# Patient Record
Sex: Female | Born: 1990 | Race: Black or African American | Hispanic: No | Marital: Single | Smoking: Current some day smoker
Health system: Southern US, Community
[De-identification: ages and names within clinical notes are randomized; demographics above are authoritative.]

## PROBLEM LIST (undated history)

## (undated) ENCOUNTER — Inpatient Hospital Stay (HOSPITAL_COMMUNITY): Payer: Self-pay

## (undated) DIAGNOSIS — Z789 Other specified health status: Secondary | ICD-10-CM

## (undated) HISTORY — PX: CHOLECYSTECTOMY: SHX55

## (undated) HISTORY — PX: TONSILLECTOMY: SUR1361

## (undated) HISTORY — PX: HERNIA REPAIR: SHX51

---

## 2016-11-26 ENCOUNTER — Encounter (HOSPITAL_BASED_OUTPATIENT_CLINIC_OR_DEPARTMENT_OTHER): Payer: Self-pay | Admitting: Emergency Medicine

## 2016-11-26 ENCOUNTER — Emergency Department (HOSPITAL_BASED_OUTPATIENT_CLINIC_OR_DEPARTMENT_OTHER): Payer: Medicaid Other

## 2016-11-26 ENCOUNTER — Emergency Department (HOSPITAL_BASED_OUTPATIENT_CLINIC_OR_DEPARTMENT_OTHER)
Admission: EM | Admit: 2016-11-26 | Discharge: 2016-11-26 | Disposition: A | Discharge status: Home / Self Care | Payer: Medicaid Other | Attending: Emergency Medicine | Admitting: Emergency Medicine

## 2016-11-26 DIAGNOSIS — R102 Pelvic and perineal pain: Secondary | ICD-10-CM | POA: Diagnosis not present

## 2016-11-26 DIAGNOSIS — M549 Dorsalgia, unspecified: Secondary | ICD-10-CM | POA: Insufficient documentation

## 2016-11-26 DIAGNOSIS — R1084 Generalized abdominal pain: Secondary | ICD-10-CM | POA: Diagnosis not present

## 2016-11-26 DIAGNOSIS — O99331 Smoking (tobacco) complicating pregnancy, first trimester: Secondary | ICD-10-CM | POA: Diagnosis not present

## 2016-11-26 DIAGNOSIS — F17200 Nicotine dependence, unspecified, uncomplicated: Secondary | ICD-10-CM | POA: Insufficient documentation

## 2016-11-26 DIAGNOSIS — O26891 Other specified pregnancy related conditions, first trimester: Secondary | ICD-10-CM | POA: Insufficient documentation

## 2016-11-26 DIAGNOSIS — Z3A01 Less than 8 weeks gestation of pregnancy: Secondary | ICD-10-CM | POA: Diagnosis not present

## 2016-11-26 LAB — COMPREHENSIVE METABOLIC PANEL
ALT: 9 U/L — ABNORMAL LOW (ref 14–54)
AST: 12 U/L — ABNORMAL LOW (ref 15–41)
Albumin: 3.8 g/dL (ref 3.5–5.0)
Alkaline Phosphatase: 45 U/L (ref 38–126)
Anion gap: 6 (ref 5–15)
BUN: 8 mg/dL (ref 6–20)
CO2: 21 mmol/L — ABNORMAL LOW (ref 22–32)
Calcium: 8.8 mg/dL — ABNORMAL LOW (ref 8.9–10.3)
Chloride: 109 mmol/L (ref 101–111)
Creatinine, Ser: 0.62 mg/dL (ref 0.44–1.00)
GFR calc Af Amer: >60 mL/min (ref >60)
GFR calc non Af Amer: >60 mL/min (ref >60)
Glucose, Bld: 96 mg/dL (ref 65–99)
Potassium: 3.9 mmol/L (ref 3.5–5.1)
Sodium: 136 mmol/L (ref 135–145)
Total Bilirubin: 0.9 mg/dL (ref 0.3–1.2)
Total Protein: 6.1 g/dL — ABNORMAL LOW (ref 6.5–8.1)

## 2016-11-26 LAB — URINALYSIS, MICROSCOPIC (REFLEX)

## 2016-11-26 LAB — CBC WITH DIFFERENTIAL/PLATELET
Basophils Absolute: 0 10*3/uL (ref 0.0–0.1)
Basophils Relative: 0 %
Eosinophils Absolute: 0.1 10*3/uL (ref 0.0–0.7)
Eosinophils Relative: 2 %
HCT: 42.2 % (ref 36.0–46.0)
Hemoglobin: 14.1 g/dL (ref 12.0–15.0)
Lymphocytes Relative: 19 %
Lymphs Abs: 1.4 10*3/uL (ref 0.7–4.0)
MCH: 28.8 pg (ref 26.0–34.0)
MCHC: 33.4 g/dL (ref 30.0–36.0)
MCV: 86.1 fL (ref 78.0–100.0)
Monocytes Absolute: 0.6 10*3/uL (ref 0.1–1.0)
Monocytes Relative: 9 %
Neutro Abs: 5.3 10*3/uL (ref 1.7–7.7)
Neutrophils Relative %: 70 %
Platelets: 165 10*3/uL (ref 150–400)
RBC: 4.9 MIL/uL (ref 3.87–5.11)
RDW: 13 % (ref 11.5–15.5)
WBC: 7.5 10*3/uL (ref 4.0–10.5)

## 2016-11-26 LAB — HCG, QUANTITATIVE, PREGNANCY: hCG, Beta Chain, Quant, S: 569 m[IU]/mL — ABNORMAL HIGH (ref <5)

## 2016-11-26 LAB — URINALYSIS, ROUTINE W REFLEX MICROSCOPIC
Bilirubin Urine: NEGATIVE
GLUCOSE, UA: NEGATIVE mg/dL
HGB URINE DIPSTICK: NEGATIVE
Ketones, ur: 15 mg/dL — AB
Nitrite: NEGATIVE
PH: 6 (ref 5.0–8.0)
Protein, ur: NEGATIVE mg/dL
SPECIFIC GRAVITY, URINE: 1.017 (ref 1.005–1.030)

## 2016-11-26 LAB — LIPASE, BLOOD: Lipase: 20 U/L (ref 11–51)

## 2016-11-26 LAB — ABO/RH: ABO/RH(D): B POS

## 2016-11-26 LAB — PREGNANCY, URINE: Preg Test, Ur: POSITIVE — AB

## 2016-11-26 MED ORDER — CALCIUM CARBONATE ANTACID 500 MG PO CHEW
1.0000 | CHEWABLE_TABLET | Freq: Once | ORAL | Status: AC
  Administered 2016-11-26: 200 mg via ORAL
  Filled 2016-11-26: qty 1

## 2016-11-26 NOTE — Discharge Instructions (Signed)
Try Pepcid over the counter for the next few days for your abdominal symptoms. You may take tylenol for your pain but do not take ibuprofen.

## 2016-11-26 NOTE — ED Triage Notes (Addendum)
Generalized abdominal pain for two weeks.  Pt states she feels like something is moving in her abdomen.  Pt recently stopped breast feeding.  Some diarrhea today.  Poor appetite.  No vaginal discharge or odor.  No N/V

## 2016-11-26 NOTE — ED Provider Notes (Signed)
[facility]   CSN: [id number] Arrival date & time: 11/26/16  0759    History   Chief Complaint Chief Complaint  Patient presents with  . Abdominal Pain    HPI Sonia Mclaughlin is a 26 y.o. female who presents to the ED with 2 weeks of progressively worsening abdominal pain. She states noticing intermittent pain which she describes as "like butterflies in your stomach but painful" 2 weeks ago, which has since become constant. Pain is 5/10 and migrates "all over my belly and back", worse with movement and changing positions. Associated with low back pain. She has taken ibuprofen this week for the pain which is minimally helpful. Associated with one episode of diarrhea this morning but otherwise reports bowel movements have been normal. Denies fever/chills, CP/SOB, HA, n/v, dysuria, hematuria, melena, malodorous urine or vaginal discharge, and vaginal itching. She is unsure if she is pregnant and states periods have been irregular as she has been breastfeeding, which she recently stopped. LMP last month for 5 days. Of note, she states she was pregnant in November but miscarried.   The history is provided by the patient.    No past medical history on file.  There are no active problems to display for this patient.   Past Surgical History:  Procedure Laterality Date  . CHOLECYSTECTOMY    . HERNIA REPAIR    . TONSILLECTOMY      OB History    Gravida Para Term Preterm AB Living   1             SAB TAB Ectopic Multiple Live Births                   Home Medications    Prior to Admission medications   Medication Sig Start Date End Date Taking? Authorizing Provider  ibuprofen (ADVIL,MOTRIN) 100 MG/5ML suspension Take 200 mg by mouth every 4 (four) hours as needed.   Yes Historical Provider, MD    Family History No family history on file.  Social History Social History  Substance Use Topics  . Smoking status: Current Every Day Smoker  .  Smokeless tobacco: Never Used  . Alcohol use Not on file     Allergies   Sulfa antibiotics   Review of Systems Review of Systems  Constitutional: Negative for chills and fever.  HENT: Negative for congestion and sore throat.   Eyes: Negative for discharge.  Respiratory: Negative for cough and shortness of breath.   Cardiovascular: Negative for chest pain and leg swelling.  Gastrointestinal: Positive for abdominal pain. Negative for abdominal distention, blood in stool, constipation, diarrhea, nausea and vomiting.  Genitourinary: Negative for dysuria, hematuria, vaginal bleeding, vaginal discharge and vaginal pain.  Musculoskeletal: Positive for back pain. Negative for arthralgias.  Skin: Negative for rash and wound.  Neurological: Negative for dizziness, syncope and headaches.     Physical Exam Updated Vital Signs BP 134/81 (BP Location: Right Arm)   Pulse 62   Temp 98.7 F (37.1 C) (Oral)   Resp 18   Ht 5\' 8"  (1.727 m)   Wt 99.8 kg   LMP 10/24/2016   SpO2 100%   BMI 33.45 kg/m   Physical Exam  Constitutional: She is oriented to person, place, and time. She appears well-developed and well-nourished. No distress.  HENT:  Head: Normocephalic and atraumatic.  Eyes: Conjunctivae are normal. Right eye exhibits no discharge. Left eye exhibits no discharge. No scleral icterus.  Neck: Neck supple. No JVD present.  No tracheal deviation present. No thyromegaly present.  Cardiovascular: Normal rate, regular rhythm, normal heart sounds and intact distal pulses.   Pulmonary/Chest: Effort normal and breath sounds normal.  Abdominal: Soft. Bowel sounds are normal. She exhibits no distension and no mass. There is tenderness. There is no rebound.  ttp of epigastrium, negative Murphy's, negative psoas sign, no ttp at McBurney's point.   Genitourinary:  Genitourinary Comments: No CVA tenderness  Musculoskeletal: Normal range of motion. She exhibits no edema or deformity.  Midline low  back ttp.   Neurological: She is alert and oriented to person, place, and time.  Skin: Skin is warm and dry. Capillary refill takes less than 2 seconds. She is not diaphoretic.  Psychiatric: She has a normal mood and affect. Her behavior is normal. Judgment and thought content normal.     ED Treatments / Results  Labs (all labs ordered are listed, but only abnormal results are displayed) Labs Reviewed  PREGNANCY, URINE - Abnormal; Notable for the following:       Result Value   Preg Test, Ur POSITIVE (*)    All other components within normal limits  URINALYSIS, ROUTINE W REFLEX MICROSCOPIC - Abnormal; Notable for the following:    APPearance CLOUDY (*)    Ketones, ur 15 (*)    Leukocytes, UA SMALL (*)    All other components within normal limits  COMPREHENSIVE METABOLIC PANEL - Abnormal; Notable for the following:    CO2 21 (*)    Calcium 8.8 (*)    Total Protein 6.1 (*)    AST 12 (*)    ALT 9 (*)    All other components within normal limits  URINALYSIS, MICROSCOPIC (REFLEX) - Abnormal; Notable for the following:    Bacteria, UA MANY (*)    Squamous Epithelial / LPF 0-5 (*)    All other components within normal limits  HCG, QUANTITATIVE, PREGNANCY - Abnormal; Notable for the following:    hCG, Beta Chain, Quant, S 569 (*)    All other components within normal limits  URINE CULTURE  CBC WITH DIFFERENTIAL/PLATELET  LIPASE, BLOOD  ABO/RH    EKG  EKG Interpretation None       Radiology US Ob Comp < 14 Wks  Result Date: 11/26/2016 CLINICAL DATA:  Pelvic pain for 2 weeks with positive pregnancy test EXAM: OBSTETRIC <14 WK [place] AND TRANSVAGINAL OB US TECHNIQUE: Both transabdominal and transvaginal ultrasound examinations were performed for complete evaluation of the gestation as well as the maternal uterus, adnexal regions, and pelvic cul-de-sac. Transvaginal technique was performed to assess early pregnancy. COMPARISON:  None. FINDINGS: Decidual reaction is noted within the  uterus. A tiny hypoechoic area is noted within the endometrium although no definitive gestational sac is seen. Given the beta HCG level it is likely too early for significant visualization. The right ovary is within normal limits. The left ovary demonstrates a 1.6 cm partially cystic lesion likely representing a complicated cyst. No significant free pelvic fluid is noted. IMPRESSION: Tiny hypoechoic area within the endometrium which may represent a very early gestational sac. Continued follow-up and monitoring of beta HCG levels is recommended. Partially cystic lesion in the left ovary. Electronically Signed   By: Alcide Clever M.D.   On: 11/26/2016 10:48   US Ob Transvaginal  Result Date: 11/26/2016 CLINICAL DATA:  Pelvic pain for 2 weeks with positive pregnancy test EXAM: OBSTETRIC <14 WK [place] AND TRANSVAGINAL OB US TECHNIQUE: Both transabdominal and transvaginal ultrasound examinations were performed for complete  evaluation of the gestation as well as the maternal uterus, adnexal regions, and pelvic cul-de-sac. Transvaginal technique was performed to assess early pregnancy. COMPARISON:  None. FINDINGS: Decidual reaction is noted within the uterus. A tiny hypoechoic area is noted within the endometrium although no definitive gestational sac is seen. Given the beta HCG level it is likely too early for significant visualization. The right ovary is within normal limits. The left ovary demonstrates a 1.6 cm partially cystic lesion likely representing a complicated cyst. No significant free pelvic fluid is noted. IMPRESSION: Tiny hypoechoic area within the endometrium which may represent a very early gestational sac. Continued follow-up and monitoring of beta HCG levels is recommended. Partially cystic lesion in the left ovary. Electronically Signed   By: Alcide Clever M.D.   On: 11/26/2016 10:48    Procedures Procedures (including critical care time)  Medications Ordered in ED Medications  calcium carbonate  (TUMS - dosed in mg elemental calcium) chewable tablet 200 mg of elemental calcium (200 mg of elemental calcium Oral Given 11/26/16 1125)     Initial Impression / Assessment and Plan / ED Course  I have reviewed the triage vital signs and the nursing notes.  Pertinent labs & imaging results that were available during my care of the patient were reviewed by me and considered in my medical decision making (see chart for details).     25yof presents to ED with chief complaint generalized abdominal pain and low back pain for 2 weeks. Pt afebrile VSS. CBC and CMP unremarkable. No obvious infection on UA, urine sample sent for culture. Urine positive for pregnancy, quantitative B-HCG 569 consistent with gest age 34 weeks. Low suspicion of ectopic, PID in absence of fever/chills and adnexal tenderness. Transvaginal US shows a hypoechoic area within the endometrium that may indicate early gestational sac and partially cystic lesion of left ovary. However, cannot rule out presence of ectopic pregnancy at this time with low beta HCG, and pt will require follow up with OB to track progress of pregnancy. Provided pt with information to establish care at local women's clinic as she is new to town, and discussed medications that are safe to take in pregnancy. Advised that she may continue to take tylenol for abd pain and recommended taking OTC Pepcid for possible GERD. Extensively discussed indications to return to ED.   Pt seen and evaluated by Dr. Clarene Duke.   Final Clinical Impressions(s) / ED Diagnoses   Final diagnoses:  Less than [redacted] weeks gestation of pregnancy  Generalized abdominal pain    New Prescriptions New Prescriptions   No medications on file     Jeanie Sewer, [place] 11/26/16 1600    Laurence Spates, MD 12/04/16 [phone]

## 2016-11-27 LAB — URINE CULTURE

## 2016-12-03 ENCOUNTER — Inpatient Hospital Stay (HOSPITAL_COMMUNITY)
Admission: AD | Admit: 2016-12-03 | Discharge: 2016-12-03 | Disposition: A | Discharge status: Home / Self Care | Payer: Medicaid Other | Source: Ambulatory Visit | Attending: Family Medicine | Admitting: Family Medicine

## 2016-12-03 ENCOUNTER — Inpatient Hospital Stay (HOSPITAL_COMMUNITY): Payer: Medicaid Other

## 2016-12-03 ENCOUNTER — Encounter (HOSPITAL_COMMUNITY): Payer: Self-pay | Admitting: *Deleted

## 2016-12-03 DIAGNOSIS — B9689 Other specified bacterial agents as the cause of diseases classified elsewhere: Secondary | ICD-10-CM | POA: Diagnosis not present

## 2016-12-03 DIAGNOSIS — O23591 Infection of other part of genital tract in pregnancy, first trimester: Secondary | ICD-10-CM | POA: Diagnosis not present

## 2016-12-03 DIAGNOSIS — O26899 Other specified pregnancy related conditions, unspecified trimester: Secondary | ICD-10-CM

## 2016-12-03 DIAGNOSIS — R109 Unspecified abdominal pain: Secondary | ICD-10-CM

## 2016-12-03 DIAGNOSIS — O26891 Other specified pregnancy related conditions, first trimester: Secondary | ICD-10-CM | POA: Diagnosis not present

## 2016-12-03 DIAGNOSIS — O99331 Smoking (tobacco) complicating pregnancy, first trimester: Secondary | ICD-10-CM | POA: Diagnosis not present

## 2016-12-03 DIAGNOSIS — F17200 Nicotine dependence, unspecified, uncomplicated: Secondary | ICD-10-CM | POA: Insufficient documentation

## 2016-12-03 DIAGNOSIS — Z3A01 Less than 8 weeks gestation of pregnancy: Secondary | ICD-10-CM | POA: Insufficient documentation

## 2016-12-03 DIAGNOSIS — N760 Acute vaginitis: Secondary | ICD-10-CM

## 2016-12-03 HISTORY — DX: Other specified health status: Z78.9

## 2016-12-03 LAB — URINALYSIS, ROUTINE W REFLEX MICROSCOPIC
Bilirubin Urine: NEGATIVE
Glucose, UA: NEGATIVE mg/dL
Hgb urine dipstick: NEGATIVE
KETONES UR: NEGATIVE mg/dL
LEUKOCYTES UA: NEGATIVE
NITRITE: NEGATIVE
PH: 7 (ref 5.0–8.0)
PROTEIN: NEGATIVE mg/dL
Specific Gravity, Urine: 1.018 (ref 1.005–1.030)

## 2016-12-03 LAB — WET PREP, GENITAL
Sperm: NONE SEEN
Trich, Wet Prep: NONE SEEN
YEAST WET PREP: NONE SEEN

## 2016-12-03 LAB — CBC
HCT: 42 % (ref 36.0–46.0)
HEMOGLOBIN: 13.7 g/dL (ref 12.0–15.0)
MCH: 28.6 pg (ref 26.0–34.0)
MCHC: 32.6 g/dL (ref 30.0–36.0)
MCV: 87.7 fL (ref 78.0–100.0)
PLATELETS: 215 10*3/uL (ref 150–400)
RBC: 4.79 MIL/uL (ref 3.87–5.11)
RDW: 13.2 % (ref 11.5–15.5)
WBC: 9 10*3/uL (ref 4.0–10.5)

## 2016-12-03 LAB — HCG, QUANTITATIVE, PREGNANCY: HCG, BETA CHAIN, QUANT, S: 10447 m[IU]/mL — AB (ref <5)

## 2016-12-03 MED ORDER — METRONIDAZOLE 500 MG PO TABS: 500.0000 mg | ORAL_TABLET | Freq: Two times a day (BID) | ORAL | 0 refills | Status: AC

## 2016-12-03 MED ORDER — METRONIDAZOLE 500 MG PO TABS: 500.0000 mg | ORAL_TABLET | Freq: Two times a day (BID) | ORAL | 0 refills | Status: DC

## 2016-12-03 NOTE — Discharge Instructions (Signed)
Bacterial Vaginosis °Bacterial vaginosis is an infection of the vagina. It happens when too many germs (bacteria) grow in the vagina. This infection puts you at risk for infections from sex (STIs). Treating this infection can lower your risk for some STIs. You should also treat this if you are pregnant. It can cause your baby to be born early. °Follow these instructions at home: °Medicines °· Take over-the-counter and prescription medicines only as told by your doctor. °· Take or use your antibiotic medicine as told by your doctor. Do not stop taking or using it even if you start to feel better. °General instructions °· If you your sexual partner is a woman, tell her that you have this infection. She needs to get treatment if she has symptoms. If you have a female partner, he does not need to be treated. °· During treatment: °? Avoid sex. °? Do not douche. °? Avoid alcohol as told. °? Avoid breastfeeding as told. °· Drink enough fluid to keep your pee (urine) clear or pale yellow. °· Keep your vagina and butt (rectum) clean. °? Wash the area with warm water every day. °? Wipe from front to back after you use the toilet. °· Keep all follow-up visits as told by your doctor. This is important. °Preventing this condition °· Do not douche. °· Use only warm water to wash around your vagina. °· Use protection when you have sex. This includes: °? Latex condoms. °? Dental dams. °· Limit how many people you have sex with. It is best to only have sex with the same person (be monogamous). °· Get tested for STIs. Have your partner get tested. °· Wear underwear that is cotton or lined with cotton. °· Avoid tight pants and pantyhose. This is most important in summer. °· Do not use any products that have nicotine or tobacco in them. These include cigarettes and e-cigarettes. If you need help quitting, ask your doctor. °· Do not use illegal drugs. °· Limit how much alcohol you drink. °Contact a doctor if: °· Your symptoms do not get  better, even after you are treated. °· You have more discharge or pain when you pee (urinate). °· You have a fever. °· You have pain in your belly (abdomen). °· You have pain with sex. °· Your bleed from your vagina between periods. °Summary °· This infection happens when too many germs (bacteria) grow in the vagina. °· Treating this condition can lower your risk for some infections from sex (STIs). °· You should also treat this if you are pregnant. It can cause early (premature) birth. °· Do not stop taking or using your antibiotic medicine even if you start to feel better. °This information is not intended to replace advice given to you by your health care provider. Make sure you discuss any questions you have with your health care provider. °Document Released: 06/04/2008 Document Revised: 05/11/2016 Document Reviewed: 05/11/2016 °Elsevier Interactive Patient Education © 2017 [employer] ° °First Trimester of Pregnancy °The first trimester of pregnancy is from week 1 until the end of week 13 (months 1 through 3). During this time, your baby will begin to develop inside you. At 6-8 weeks, the eyes and face are formed, and the heartbeat can be seen on ultrasound. At the end of 12 weeks, all the baby's organs are formed. Prenatal care is all the medical care you receive before the birth of your baby. Make sure you get good prenatal care and follow all of your doctor's instructions. °Follow these   instructions at home: °Medicines °· Take over-the-counter and prescription medicines only as told by your doctor. Some medicines are safe and some medicines are not safe during pregnancy. °· Take a prenatal vitamin that contains at least 600 micrograms (mcg) of folic acid. °· If you have trouble pooping (constipation), take medicine that will make your stool soft (stool softener) if your doctor approves. °Eating and drinking °· Eat regular, healthy meals. °· Your doctor will tell you the amount of weight gain that is right  for you. °· Avoid raw meat and uncooked cheese. °· If you feel sick to your stomach (nauseous) or throw up (vomit): °? Eat 4 or 5 small meals a day instead of 3 large meals. °? Try eating a few soda crackers. °? Drink liquids between meals instead of during meals. °· To prevent constipation: °? Eat foods that are high in fiber, like fresh fruits and vegetables, whole grains, and beans. °? Drink enough fluids to keep your pee (urine) clear or pale yellow. °Activity °· Exercise only as told by your doctor. Stop exercising if you have cramps or pain in your lower belly (abdomen) or low back. °· Do not exercise if it is too hot, too humid, or if you are in a place of great height (high altitude). °· Try to avoid standing for long periods of time. Move your legs often if you must stand in one place for a long time. °· Avoid heavy lifting. °· Wear low-heeled shoes. Sit and stand up straight. °· You can have sex unless your doctor tells you not to. °Relieving pain and discomfort °· Wear a good support bra if your breasts are sore. °· Take warm water baths (sitz baths) to soothe pain or discomfort caused by hemorrhoids. Use hemorrhoid cream if your doctor says it is okay. °· Rest with your legs raised if you have leg cramps or low back pain. °· If you have puffy, bulging veins (varicose veins) in your legs: °? Wear support hose or compression stockings as told by your doctor. °? Raise (elevate) your feet for 15 minutes, 3-4 times a day. °? Limit salt in your food. °Prenatal care °· Schedule your prenatal visits by the twelfth week of pregnancy. °· Write down your questions. Take them to your prenatal visits. °· Keep all your prenatal visits as told by your doctor. This is important. °Safety °· Wear your seat belt at all times when driving. °· Make a list of emergency phone numbers. The list should include numbers for family, friends, the hospital, and police and fire departments. °General instructions °· Ask your doctor for  a referral to a local prenatal class. Begin classes no later than at the start of month 6 of your pregnancy. °· Ask for help if you need counseling or if you need help with nutrition. Your doctor can give you advice or tell you where to go for help. °· Do not use hot tubs, steam rooms, or saunas. °· Do not douche or use tampons or scented sanitary pads. °· Do not cross your legs for long periods of time. °· Avoid all herbs and alcohol. Avoid drugs that are not approved by your doctor. °· Do not use any tobacco products, including cigarettes, chewing tobacco, and electronic cigarettes. If you need help quitting, ask your doctor. You may get counseling or other support to help you quit. °· Avoid cat litter boxes and soil used by cats. These carry germs that can cause birth defects in the baby and   can cause a loss of your baby (miscarriage) or stillbirth. °· Visit your dentist. At home, brush your teeth with a soft toothbrush. Be gentle when you floss. °Contact a doctor if: °· You are dizzy. °· You have mild cramps or pressure in your lower belly. °· You have a nagging pain in your belly area. °· You continue to feel sick to your stomach, you throw up, or you have watery poop (diarrhea). °· You have a bad smelling fluid coming from your vagina. °· You have pain when you pee (urinate). °· You have increased puffiness (swelling) in your face, hands, legs, or ankles. °Get help right away if: °· You have a fever. °· You are leaking fluid from your vagina. °· You have spotting or bleeding from your vagina. °· You have very bad belly cramping or pain. °· You gain or lose weight rapidly. °· You throw up blood. It may look like coffee grounds. °· You are around people who have German measles, fifth disease, or chickenpox. °· You have a very bad headache. °· You have shortness of breath. °· You have any kind of trauma, such as from a fall or a car accident. °Summary °· The first trimester of pregnancy is from week 1 until the  end of week 13 (months 1 through 3). °· To take care of yourself and your unborn baby, you will need to eat healthy meals, take medicines only if your doctor tells you to do so, and do activities that are safe for you and your baby. °· Keep all follow-up visits as told by your doctor. This is important as your doctor will have to ensure that your baby is healthy and growing well. °This information is not intended to replace advice given to you by your health care provider. Make sure you discuss any questions you have with your health care provider. °Document Released: 02/12/2008 Document Revised: 09/03/2016 Document Reviewed: 09/03/2016 °Elsevier Interactive Patient Education © 2017 [employer] ° °

## 2016-12-03 NOTE — MAU Provider Note (Signed)
History     CSN: [id number]  Arrival date and time: 12/03/16 1331   None    [id number] @[redacted]w[redacted]d  by sure LMP here with abdominal pain. She reports pain started about 1 week ago. Pain is at the umbilicus and radiates to lower abdomen. Rates pain 4/10. Has not taken anything. Standing for long periods of time worsens the pain. She denies urinary sx. Denies VB or vaginal discharge. No fevers. Eating and drinking well. She was seen in another ED 1 week ago for same sx, quant was 569 and no IUP was seen. She was instructed to f/u with an OB but is new to the area and wasn't sure how to get an appt.    Past Medical History:  Diagnosis Date  . Medical history non-contributory     Past Surgical History:  Procedure Laterality Date  . CHOLECYSTECTOMY    . HERNIA REPAIR    . TONSILLECTOMY      Family History  Problem Relation Age of Onset  . Diabetes Mother   . Hypertension Mother   . Diabetes Father   . Hypertension Father   . Seizures Sister   . Seizures Brother     Social History  Substance Use Topics  . Smoking status: Current Some Day Smoker  . Smokeless tobacco: Never Used  . Alcohol use No    Allergies:  Allergies  Allergen Reactions  . Sulfa Antibiotics Anaphylaxis and Hives    Prescriptions Prior to Admission  Medication Sig Dispense Refill Last Dose  . ibuprofen (ADVIL,MOTRIN) 200 MG tablet Take 200 mg by mouth every 6 (six) hours as needed for mild pain (For tooth pain.). Patient squeezes the liquid out of an Advil liquid gel onto tooth to relieve tooth pain.   Past Week at Unknown time    Review of Systems  Constitutional: Negative for fever.  Gastrointestinal: Positive for abdominal pain. Negative for constipation and diarrhea.  Genitourinary: Negative for dysuria, frequency, hematuria, urgency, vaginal bleeding and vaginal discharge.  Neurological: Positive for light-headedness (occasionally).   Physical Exam   Blood pressure 134/76, pulse 83, temperature 98.5  F (36.9 C), resp. rate 18, height 5\' 8"  (1.727 m), weight 100.2 kg (221 lb), last menstrual period 10/24/2016.  Physical Exam  Nursing note and vitals reviewed. Constitutional: She is oriented to person, place, and time. She appears well-developed and well-nourished. No distress (appears comfortable).  HENT:  Head: Normocephalic and atraumatic.  Neck: Normal range of motion.  Cardiovascular: Normal rate.   Respiratory: Effort normal.  GI: Soft. She exhibits no distension and no mass. There is no tenderness. There is no rebound and no guarding.  Genitourinary:  Genitourinary Comments: External: no lesions or erythema Vagina: rugated, parous, small thin white frothy discharge Uterus: non enlarged, anteverted, non tender, no CMT Adnexae: no masses, no tenderness left, + tenderness right   Musculoskeletal: Normal range of motion.  Neurological: She is alert and oriented to person, place, and time.  Skin: Skin is warm and dry.  Psychiatric: She has a normal mood and affect.   Results for orders placed or performed during the hospital encounter of 12/03/16 (from the past 24 hour(s))  Urinalysis, Routine w reflex microscopic     Status: None   Collection Time: 12/03/16  2:00 PM  Result Value Ref Range   Color, Urine YELLOW YELLOW   APPearance CLEAR CLEAR   Specific Gravity, Urine 1.018 1.005 - 1.030   pH 7.0 5.0 - 8.0   Glucose, UA NEGATIVE NEGATIVE mg/dL  Hgb urine dipstick NEGATIVE NEGATIVE   Bilirubin Urine NEGATIVE NEGATIVE   Ketones, ur NEGATIVE NEGATIVE mg/dL   Protein, ur NEGATIVE NEGATIVE mg/dL   Nitrite NEGATIVE NEGATIVE   Leukocytes, UA NEGATIVE NEGATIVE  CBC     Status: None   Collection Time: 12/03/16  2:50 PM  Result Value Ref Range   WBC 9.0 4.0 - 10.5 K/uL   RBC 4.79 3.87 - 5.11 MIL/uL   Hemoglobin 13.7 12.0 - 15.0 g/dL   HCT 16.142.0 09.636.0 - 04.546.0 %   MCV 87.7 78.0 - 100.0 fL   MCH 28.6 26.0 - 34.0 pg   MCHC 32.6 30.0 - 36.0 g/dL   RDW 40.913.2 81.111.5 - 91.415.5 %    Platelets 215 150 - 400 K/uL  hCG, quantitative, pregnancy     Status: Abnormal   Collection Time: 12/03/16  2:50 PM  Result Value Ref Range   hCG, Beta Chain, Quant, S 10,447 (H) <5 mIU/mL  Wet prep, genital     Status: Abnormal   Collection Time: 12/03/16  4:00 PM  Result Value Ref Range   Yeast Wet Prep HPF POC NONE SEEN NONE SEEN   Trich, Wet Prep NONE SEEN NONE SEEN   Clue Cells Wet Prep HPF POC PRESENT (A) NONE SEEN   WBC, Wet Prep HPF POC MANY (A) NONE SEEN   Sperm NONE SEEN    [place] Ob Transvaginal  Result Date: 12/03/2016 CLINICAL DATA:  26 year old pregnant female presents with low pelvic pain and cramping for 1 week. Quantitative beta HCG 10,447, increased from 569 on 11/26/2016. EDC by LMP: 07/31/2017, projecting to an expected gestational age of [redacted] weeks 5 days. EXAM: TRANSVAGINAL OB ULTRASOUND TECHNIQUE: Transvaginal ultrasound was performed for complete evaluation of the gestation as well as the maternal uterus, adnexal regions, and pelvic cul-de-sac. COMPARISON:  11/26/2016 obstetric scan. FINDINGS: Intrauterine gestational sac: Single intrauterine gestational sac appears normal in shape and position. Yolk sac:  Visualized. Embryo:  Not Visualized. MSD: 9.4  mm   5 w   5  d Subchorionic hemorrhage:  None visualized. Maternal uterus/adnexae: Left ovarian corpus luteum. No suspicious ovarian or adnexal masses. No abnormal free fluid in the pelvis . IMPRESSION: 1. Single intrauterine gestational sac with yolk sac at 5 weeks 5 days by mean sac diameter. No embryo visualized, which could be due to early gestational age. Recommend a follow-up transvaginal pelvic ultrasound in 11-14 days to for definitive evaluation of gestational viability. This recommendation follows SRU consensus guidelines: Diagnostic Criteria for Nonviable Pregnancy Early in the First Trimester. Malva Limes Engl J Med 2013; 782:9562-13; 369:1443-51. 2. No ovarian or adnexal abnormality. Electronically Signed   By: Delbert PhenixJason A Poff M.D.   On:  12/03/2016 17:16   MAU Course  Procedures  MDM Labs and US ordered and reviewed. US consistent with early gestation, IUGS size consistent with LMP. Will treat BV. Discussed results with pt, consider f/u US for viability, pt has appt scheduled at [facility]. Stable for discharge home.  Assessment and Plan   1. Abdominal pain in pregnancy, first trimester   2. Abdominal pain in pregnancy   3. BV (bacterial vaginosis)    Discharge home Follow up as scheduled at [facility] precautions Rx Flagyl  Allergies as of 12/03/2016      Reactions   Sulfa Antibiotics Anaphylaxis, Hives      Medication List    STOP taking these medications   ibuprofen 200 MG tablet Commonly known as:  ADVIL,MOTRIN     TAKE these medications  metroNIDAZOLE 500 MG tablet Commonly known as:  FLAGYL Take 1 tablet (500 mg total) by mouth 2 (two) times daily.      Donette Larry, CNM 12/03/2016, 4:05 PM

## 2016-12-03 NOTE — MAU Note (Signed)
Pt presents to MAU with complaints of lower back pain and pulling around her belly button, PT states she is new to the area and was told to follow up for a repeat QUANT but did not know where to go and she has been calling for an appointment. Denies any vaginal bleeding

## 2016-12-04 LAB — HIV ANTIBODY (ROUTINE TESTING W REFLEX): HIV Screen 4th Generation wRfx: NONREACTIVE

## 2016-12-04 LAB — RPR, QUANT+TP ABS (REFLEX): T Pallidum Abs: NEGATIVE

## 2016-12-04 LAB — RPR: RPR Ser Ql: REACTIVE — AB

## 2016-12-05 LAB — GC/CHLAMYDIA PROBE AMP (~~LOC~~) NOT AT ARMC
Chlamydia: NEGATIVE
NEISSERIA GONORRHEA: NEGATIVE

## 2016-12-20 ENCOUNTER — Encounter: Payer: Medicaid Other | Admitting: Family Medicine

## 2017-10-23 ENCOUNTER — Encounter (HOSPITAL_COMMUNITY): Payer: Self-pay

## 2018-12-02 IMAGING — US US OB TRANSVAGINAL
1 series · 15 of 28 positions shown · non-contrast
Comparison: 11/26/2016 obstetric scan.

CLINICAL DATA: 25-year-old pregnant female presents with low pelvic
pain and cramping for 1 week. Quantitative beta HCG [DATE],
increased from 569 on 11/26/2016.

EDC by LMP: 07/31/2017, projecting to an expected gestational age of
5 weeks 5 days.
EXAM:
TRANSVAGINAL OB ULTRASOUND
TECHNIQUE: Transvaginal ultrasound was performed for complete evaluation of the
gestation as well as the maternal uterus, adnexal regions, and
pelvic cul-de-sac.

[Series 1: us ob transvaginal · 62 acquisitions, 15 frames shown]
[im 1/62]
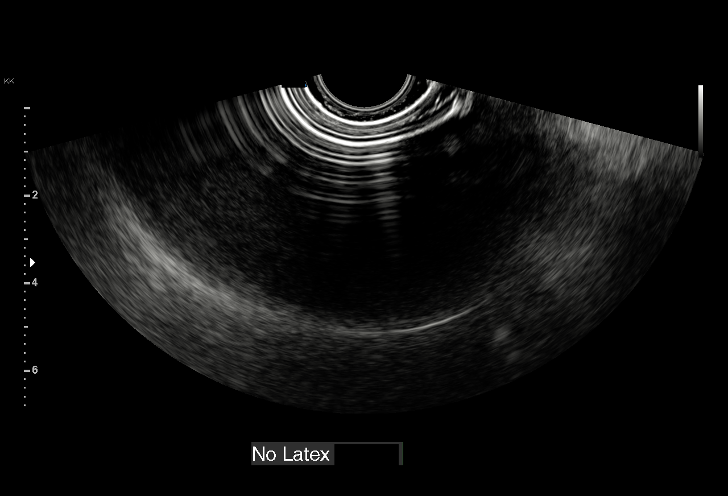
[im 5/62]
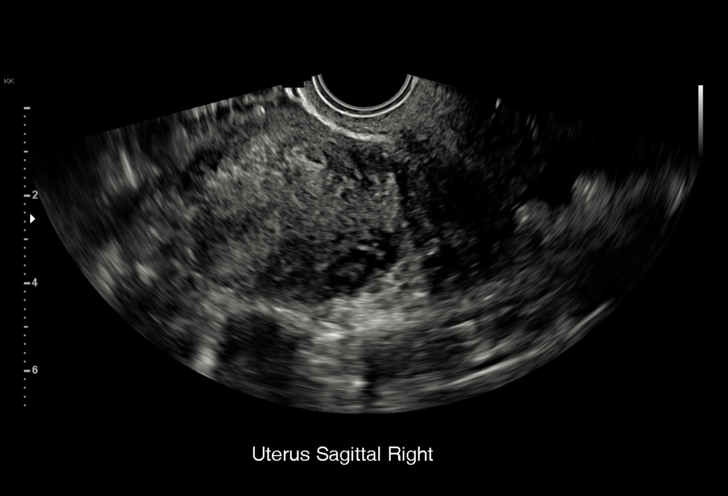
[im 10/62]
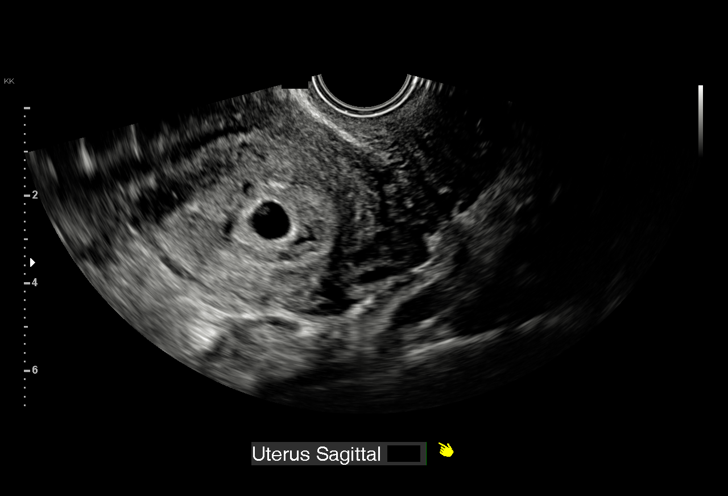
[im 14/62]
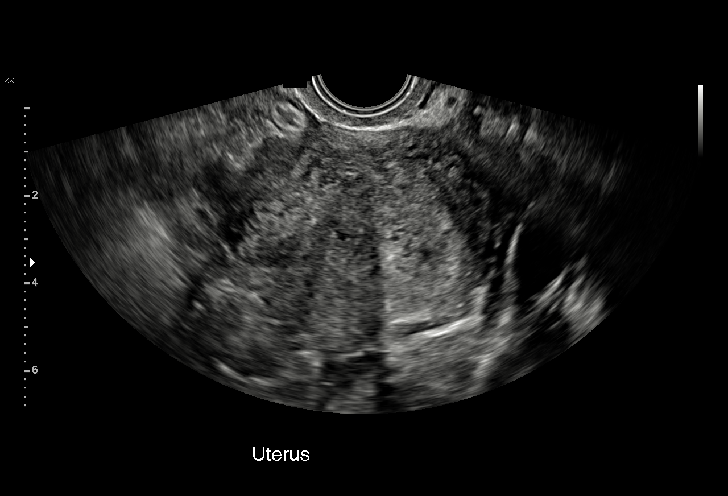
[im 19/62]
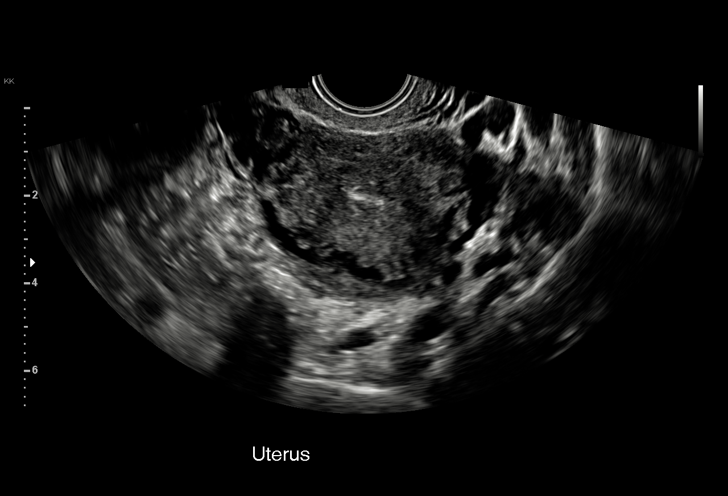
[im 23/62]
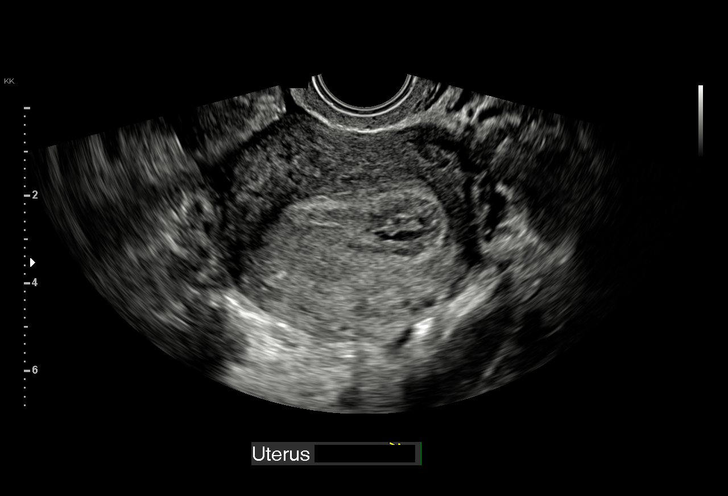
[im 28/62]
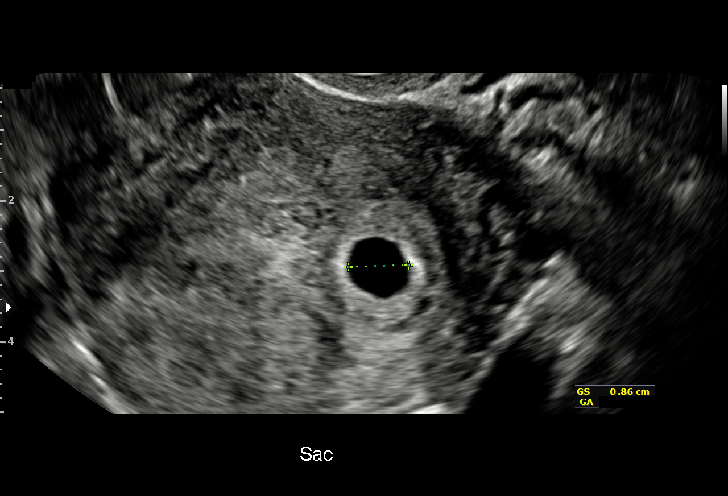
[im 32/62]
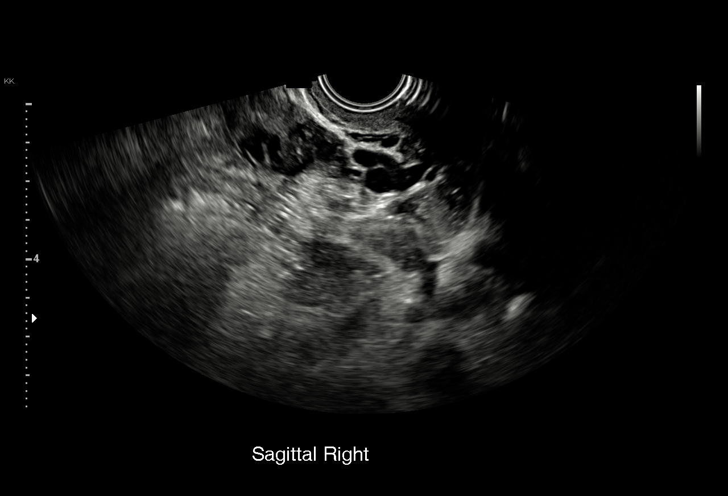
[im 34/62]
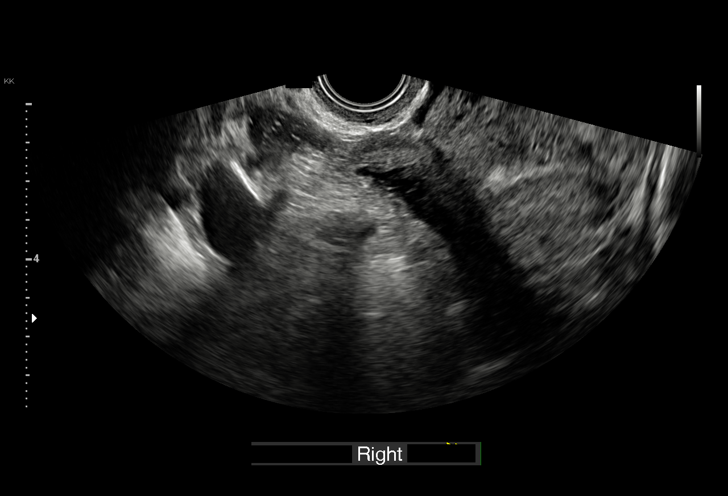
[im 39/62]
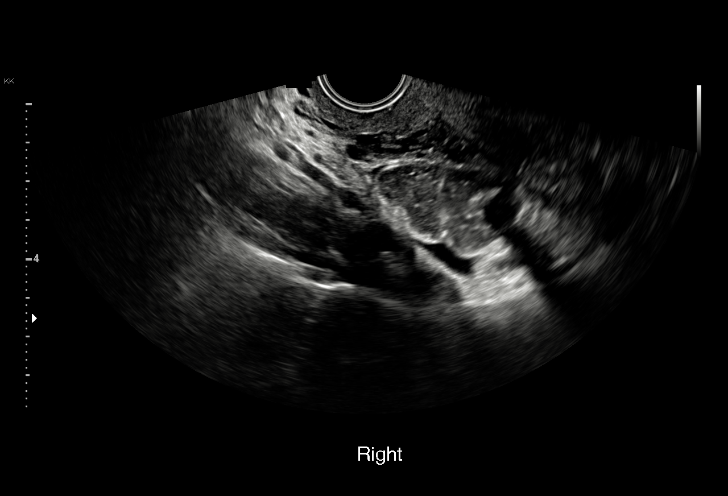
[im 43/62]
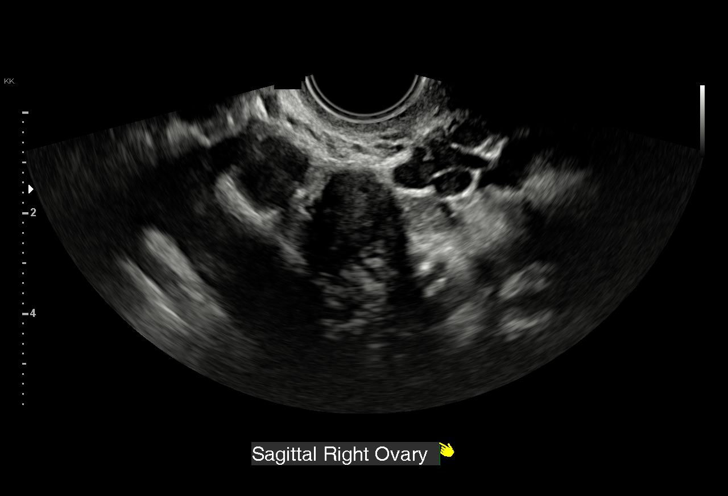
[im 48/62]
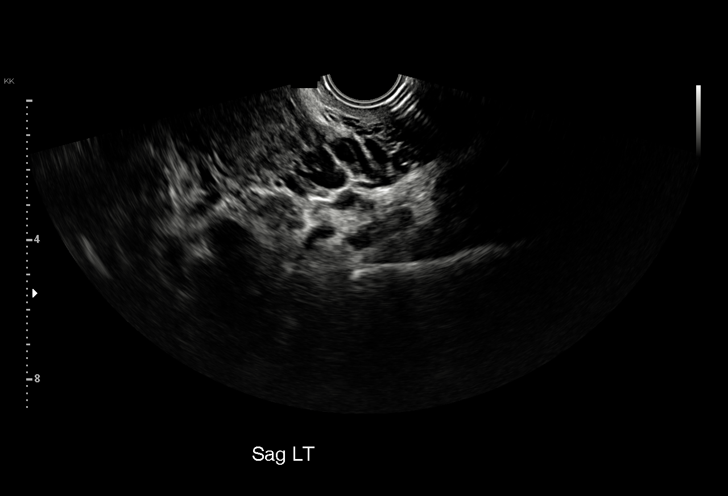
[im 52/62]
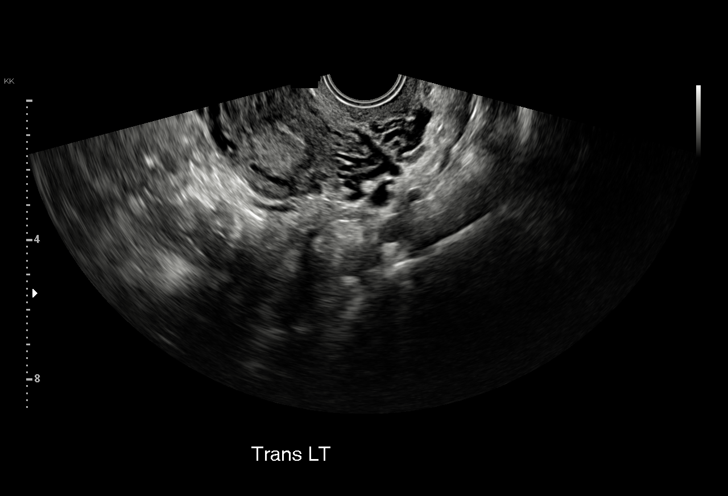
[im 57/62]
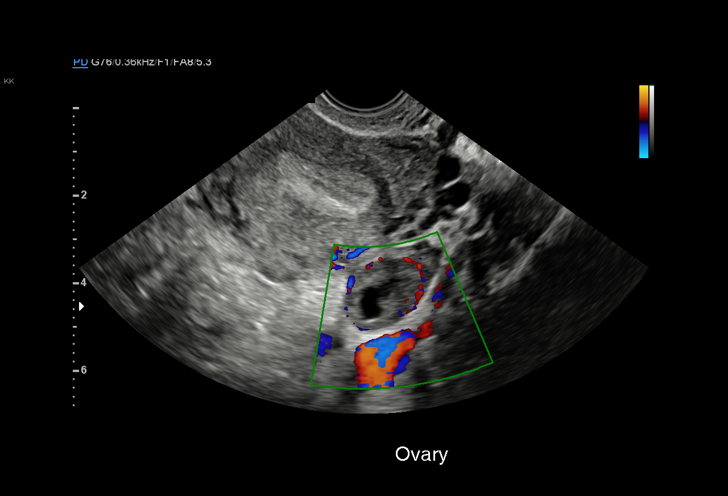
[im 62/62]
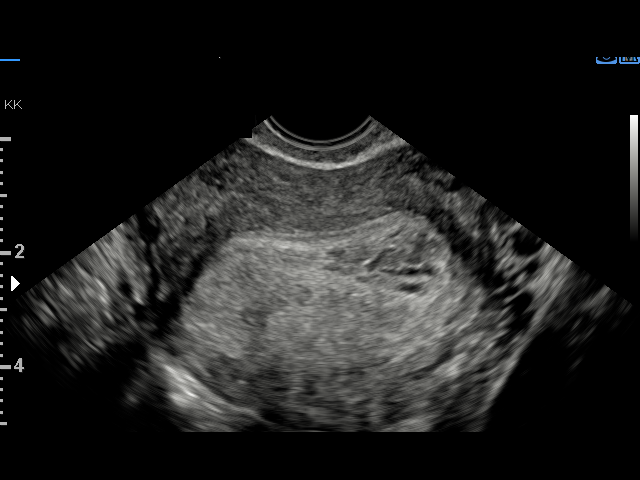

[15 of 28 positions shown; findings below may reference images not displayed]

FINDINGS: Intrauterine gestational sac: Single intrauterine gestational sac
appears normal in shape and position.

Yolk sac:  Visualized.

Embryo:  Not Visualized.

MSD: 9.4  mm   5 w   5  d

Subchorionic hemorrhage:  None visualized.

Maternal uterus/adnexae: Left ovarian corpus luteum. No suspicious
ovarian or adnexal masses. No abnormal free fluid in the pelvis .
IMPRESSION: 1. Single intrauterine gestational sac with yolk sac at 5 weeks 5
days by mean sac diameter. No embryo visualized, which could be due
to early gestational age. Recommend a follow-up transvaginal pelvic
ultrasound in 11-14 days to for definitive evaluation of gestational
viability. This recommendation follows SRU consensus guidelines:
Diagnostic Criteria for Nonviable Pregnancy Early in the First
Trimester. N Engl J Med 1134; [DATE].
2. No ovarian or adnexal abnormality.

## 2019-03-19 IMAGING — US US OB TRANSVAGINAL
1 series · 14 of 28 positions shown · non-contrast
Comparison: None.

CLINICAL DATA: Pelvic pain for 2 weeks with positive pregnancy test

EXAM:
OBSTETRIC <14 WK US AND TRANSVAGINAL OB US
TECHNIQUE: Both transabdominal and transvaginal ultrasound examinations were
performed for complete evaluation of the gestation as well as the
maternal uterus, adnexal regions, and pelvic cul-de-sac.
Transvaginal technique was performed to assess early pregnancy.

[Series 1: us ob transvaginal · 0.21mm/px · 14 of 48 slices shown]
[im 2/48]
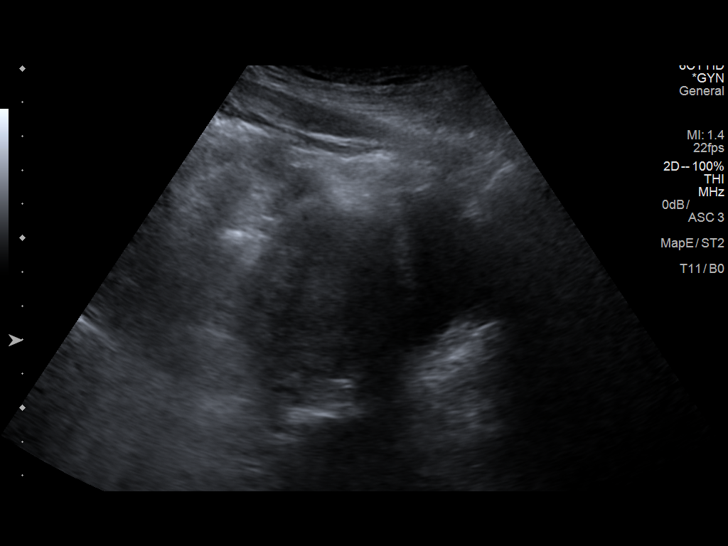
[im 6/48]
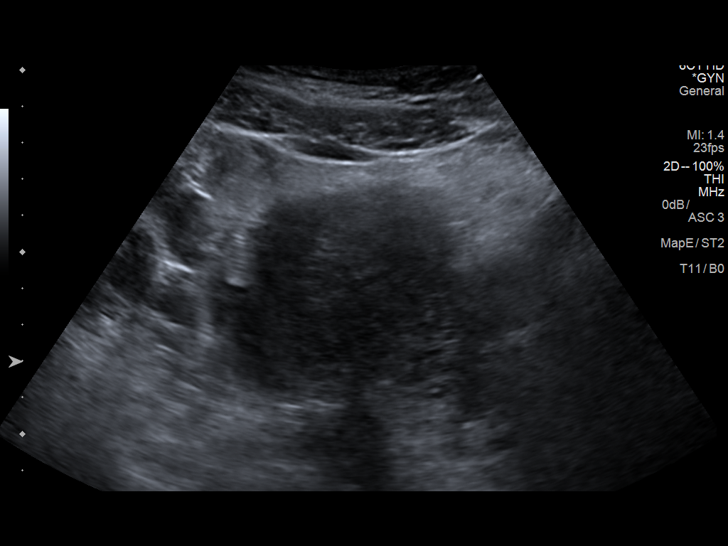
[im 9/48]
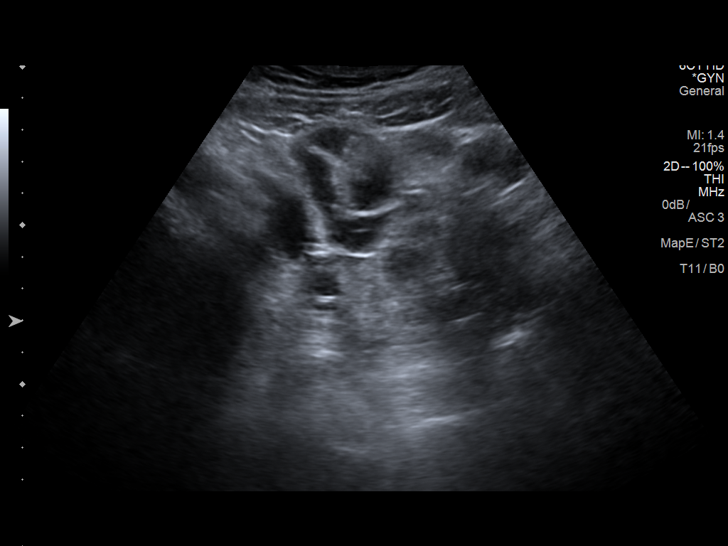
[im 13/48]
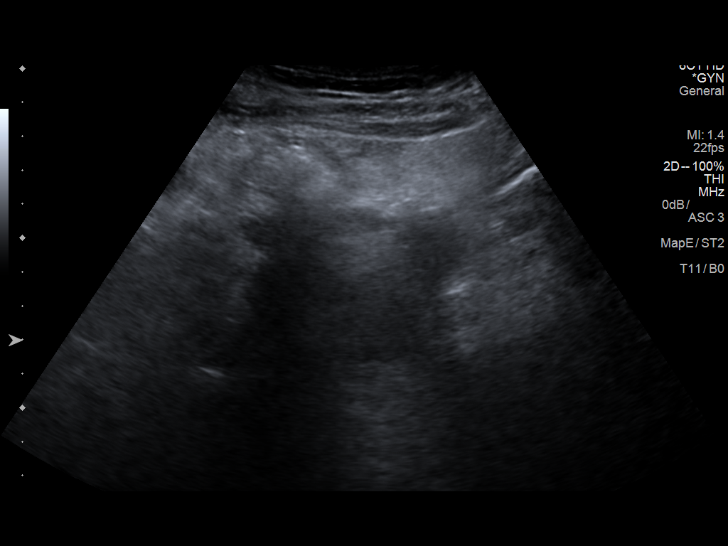
[im 16/48]
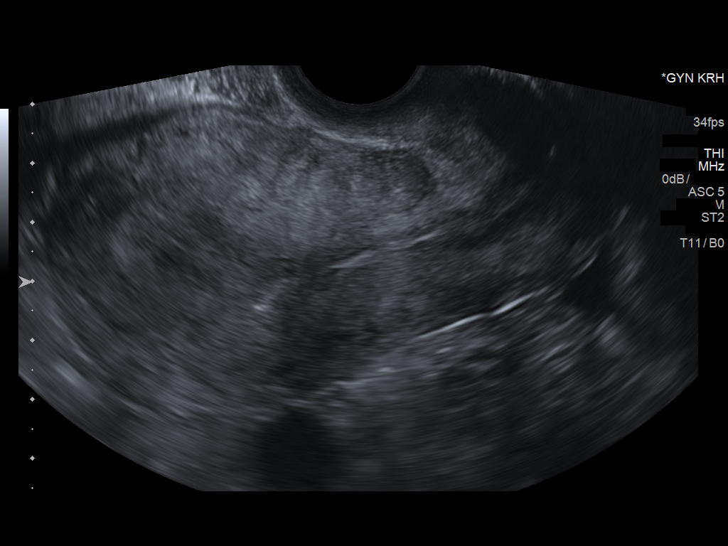
[im 20/48]
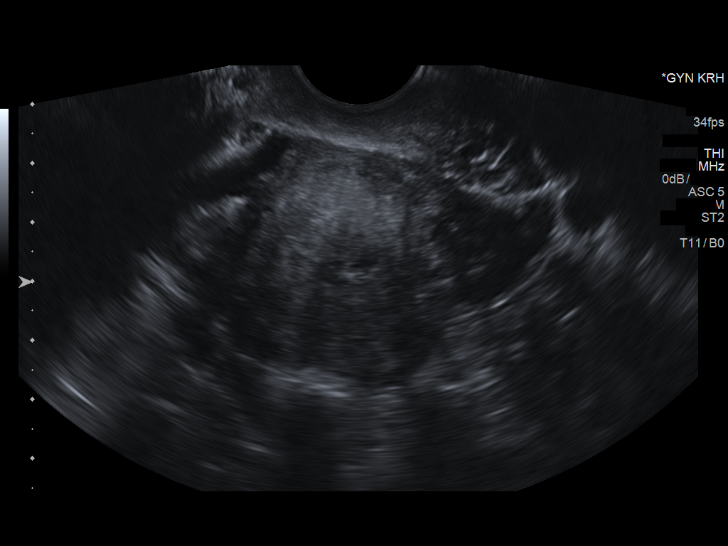
[im 23/48]
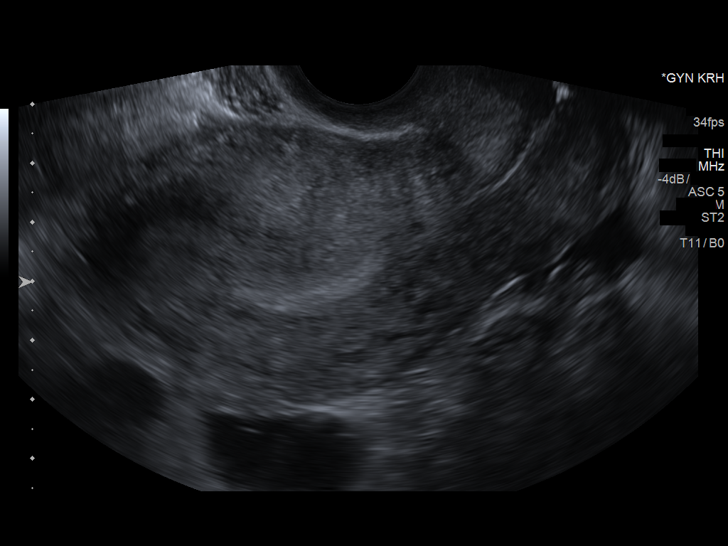
[im 27/48]
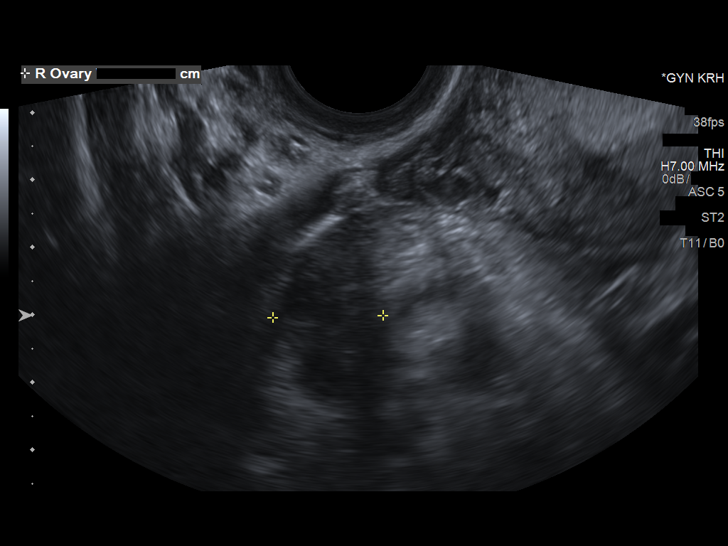
[im 30/48]
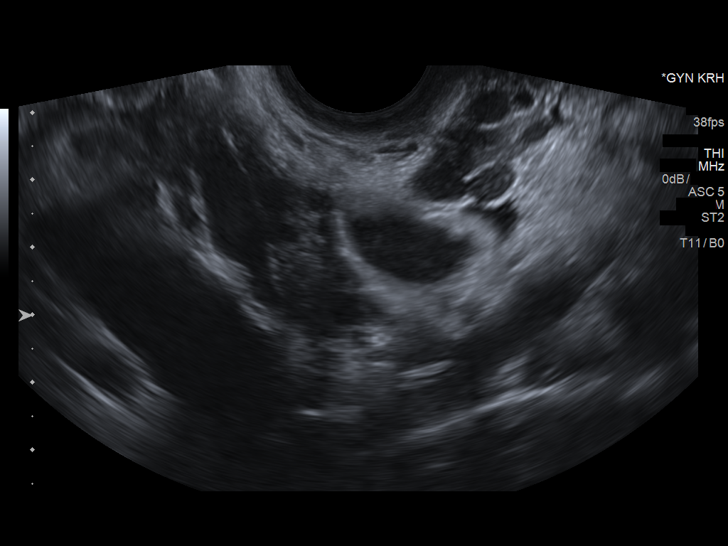
[im 34/48]
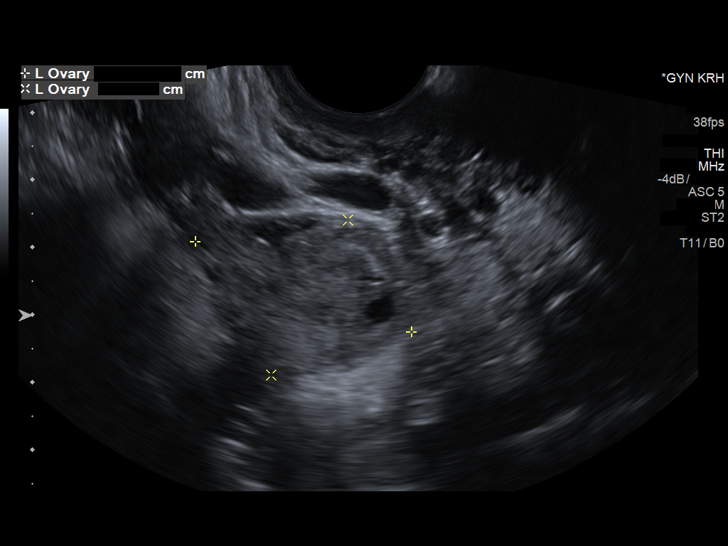
[im 37/48]
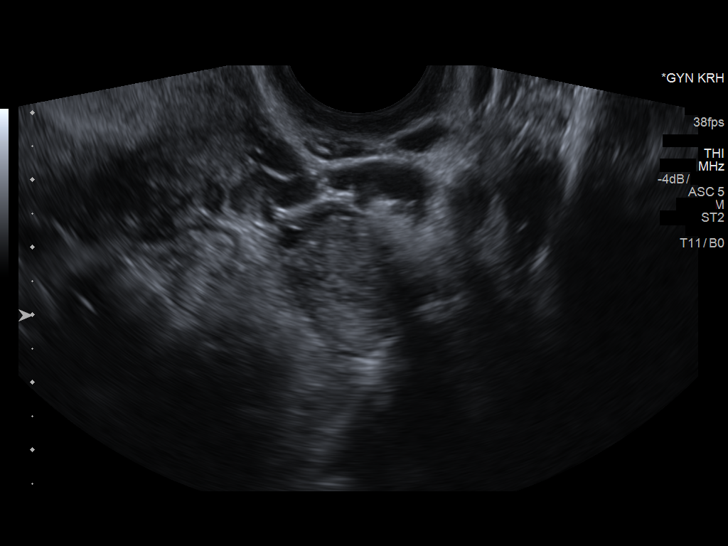
[im 41/48]
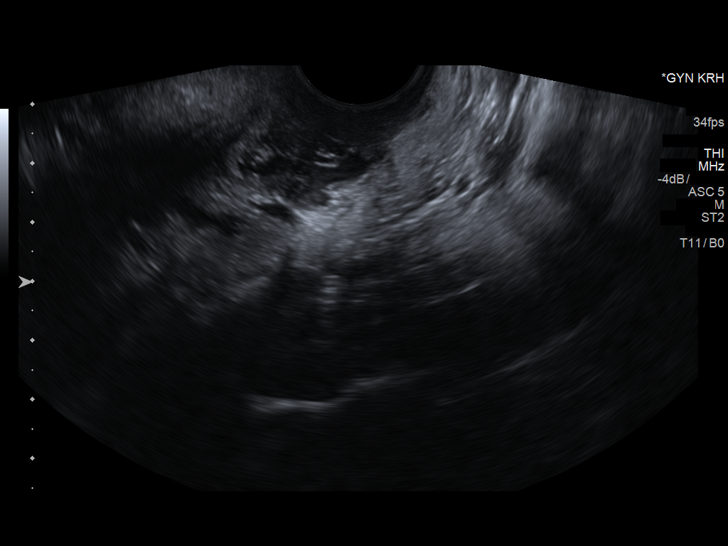
[im 44/48]
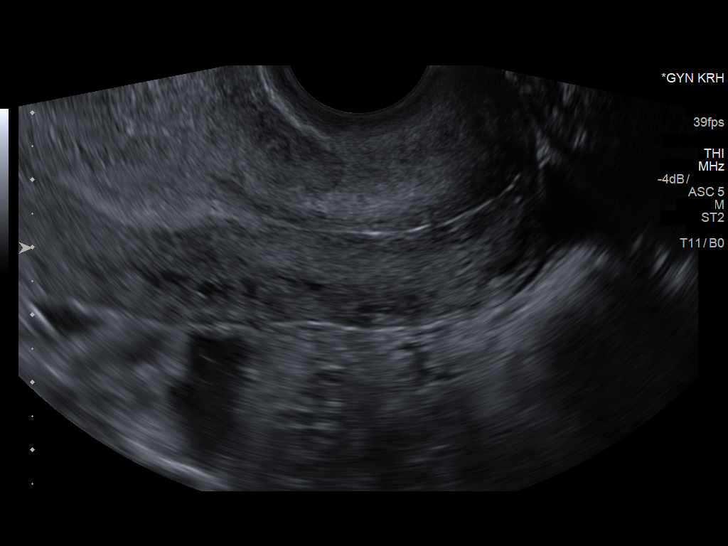
[im 48/48]
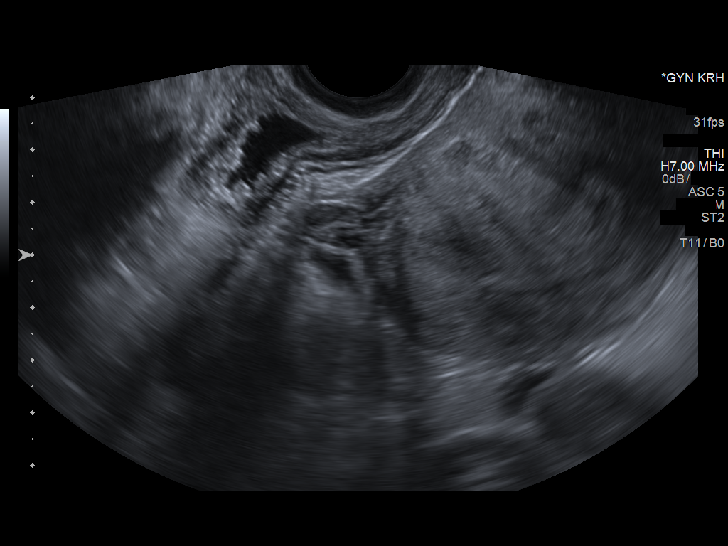

[14 of 28 positions shown; findings below may reference images not displayed]

FINDINGS: Decidual reaction is noted within the uterus. A tiny hypoechoic area
is noted within the endometrium although no definitive gestational
sac is seen. Given the beta HCG level it is likely too early for
significant visualization.

The right ovary is within normal limits. The left ovary demonstrates
a 1.6 cm partially cystic lesion likely representing a complicated
cyst. No significant free pelvic fluid is noted.
IMPRESSION: Tiny hypoechoic area within the endometrium which may represent a
very early gestational sac. Continued follow-up and monitoring of
beta HCG levels is recommended.

Partially cystic lesion in the left ovary.
